# Patient Record
Sex: Male | Born: 1990 | Race: Black or African American | Hispanic: No | Marital: Single | Smoking: Never smoker
Health system: Southern US, Community
[De-identification: ages and names within clinical notes are randomized; demographics above are authoritative.]

---
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
## 2011-03-21 ENCOUNTER — Encounter (HOSPITAL_COMMUNITY): Payer: Self-pay | Admitting: Emergency Medicine

## 2011-03-21 ENCOUNTER — Emergency Department (HOSPITAL_COMMUNITY)
Admission: EM | Admit: 2011-03-21 | Discharge: 2011-03-21 | Disposition: A | Discharge status: Home / Self Care | Payer: Self-pay | Attending: Emergency Medicine | Admitting: Emergency Medicine

## 2011-03-21 DIAGNOSIS — R369 Urethral discharge, unspecified: Secondary | ICD-10-CM | POA: Insufficient documentation

## 2011-03-21 DIAGNOSIS — A64 Unspecified sexually transmitted disease: Secondary | ICD-10-CM | POA: Insufficient documentation

## 2011-03-21 DIAGNOSIS — N390 Urinary tract infection, site not specified: Secondary | ICD-10-CM | POA: Insufficient documentation

## 2011-03-21 LAB — URINALYSIS, ROUTINE W REFLEX MICROSCOPIC
Glucose, UA: NEGATIVE mg/dL
Hgb urine dipstick: NEGATIVE
Ketones, ur: >80 mg/dL — AB
Nitrite: NEGATIVE
Protein, ur: NEGATIVE mg/dL
Specific Gravity, Urine: 1.03 (ref 1.005–1.030)
Urobilinogen, UA: 1 mg/dL (ref 0.0–1.0)
pH: 5.5 (ref 5.0–8.0)

## 2011-03-21 LAB — URINE MICROSCOPIC-ADD ON

## 2011-03-21 MED ORDER — CEFTRIAXONE SODIUM 250 MG IJ SOLR
250.0000 mg | Freq: Once | INTRAMUSCULAR | Status: AC
  Administered 2011-03-21: 250 mg via INTRAMUSCULAR
  Filled 2011-03-21: qty 250

## 2011-03-21 MED ORDER — CIPROFLOXACIN HCL 500 MG PO TABS: 500.0000 mg | ORAL_TABLET | Freq: Two times a day (BID) | ORAL | Status: AC

## 2011-03-21 MED ORDER — ACETAMINOPHEN 325 MG PO TABS
650.0000 mg | ORAL_TABLET | Freq: Once | ORAL | Status: AC
  Administered 2011-03-21: 650 mg via ORAL
  Filled 2011-03-21: qty 2

## 2011-03-21 MED ORDER — METRONIDAZOLE 500 MG PO TABS: 500.0000 mg | ORAL_TABLET | Freq: Two times a day (BID) | ORAL | Status: AC

## 2011-03-21 MED ORDER — AZITHROMYCIN 250 MG PO TABS
1000.0000 mg | ORAL_TABLET | Freq: Once | ORAL | Status: AC
  Administered 2011-03-21: 1000 mg via ORAL
  Filled 2011-03-21: qty 4

## 2011-03-21 MED ORDER — LIDOCAINE HCL (PF) 1 % IJ SOLN
2.0000 mL | Freq: Once | INTRAMUSCULAR | Status: AC
  Administered 2011-03-21: 2 mL

## 2011-03-21 MED ORDER — LIDOCAINE HCL (PF) 1 % IJ SOLN
INTRAMUSCULAR | Status: AC
  Filled 2011-03-21: qty 5

## 2011-03-21 NOTE — ED Notes (Signed)
PT. REPORTS PENILE DISCHARGE WITH DYSURIA ONSET LAST WEEK , UNPROTECTED SEXUAL ENCOUNTER 2 WEEKS AGO.

## 2011-03-21 NOTE — ED Notes (Addendum)
C/o penile d/c & dysuria, (denies: nvd, fever or other sx), denies pain at this time. Onset of sx 2 weeks ago.

## 2011-03-21 NOTE — Discharge Instructions (Signed)
You were seen and evaluated today for your symptoms of painful urination and urinary discharge. At this time your providers are concerned that you may have a sexually transmitted disease. You were treated today in the emergency room for gonorrhea and chlamydia. You also given a prescription for medicines to take to treat a possible trichomonas infection. Your providers also gave you a prescription for antibiotics to take for the next 7 days for any other urinary infections. It is recommended that he followup with the [facility] health Department sexually transmitted disease clinic for HIV and syphilis testing. You should not have any unprotected sex until you have the results of your tests. You should also inform all partners of any positive results.   Urinary Tract Infection Infections of the urinary tract can start in several places. A bladder infection (cystitis), a kidney infection (pyelonephritis), and a prostate infection (prostatitis) are different types of urinary tract infections (UTIs). They usually get better if treated with medicines (antibiotics) that kill germs. Take all the medicine until it is gone. You or your child may feel better in a few days, but TAKE ALL MEDICINE or the infection may not respond and may become more difficult to treat. HOME CARE INSTRUCTIONS   Drink enough water and fluids to keep the urine clear or pale yellow. Cranberry juice is especially recommended, in addition to large amounts of water.   Avoid caffeine, tea, and carbonated beverages. They tend to irritate the bladder.   Alcohol may irritate the prostate.   Only take over-the-counter or prescription medicines for pain, discomfort, or fever as directed by your caregiver.  To prevent further infections:  Empty the bladder often. Avoid holding urine for long periods of time.   After a bowel movement, women should cleanse from front to back. Use each tissue only once.   Empty the bladder before and  after sexual intercourse.  FINDING OUT THE RESULTS OF YOUR TEST Not all test results are available during your visit. If your or your child's test results are not back during the visit, make an appointment with your caregiver to find out the results. Do not assume everything is normal if you have not heard from your caregiver or the medical facility. It is important for you to follow up on all test results. SEEK MEDICAL CARE IF:   There is back pain.   Your baby is older than 3 months with a rectal temperature of 100.5 F (38.1 C) or higher for more than 1 day.   Your or your child's problems (symptoms) are no better in 3 days. Return sooner if you or your child is getting worse.  SEEK IMMEDIATE MEDICAL CARE IF:   There is severe back pain or lower abdominal pain.   You or your child develops chills.   You have a fever.   Your baby is older than 3 months with a rectal temperature of 102 F (38.9 C) or higher.   Your baby is 66 months old or younger with a rectal temperature of 100.4 F (38 C) or higher.   There is nausea or vomiting.   There is continued burning or discomfort with urination.  MAKE SURE YOU:   Understand these instructions.   Will watch your condition.   Will get help right away if you are not doing well or get worse.  Document Released: 10/13/2004 Document Revised: 12/23/2010 Document Reviewed: 05/18/2006 [facility] Patient Information 2012 [place], [place].     Sexually Transmitted Disease Sexually transmitted disease (  STD) refers to any infection that is passed from person to person during sexual activity. This may happen by way of saliva, semen, blood, vaginal mucus, or urine. Common STDs include:  Gonorrhea.   Chlamydia.   Syphilis.   HIV/AIDS.   Genital herpes.   Hepatitis B and C.   Trichomonas.   Human papillomavirus (HPV).   Pubic lice.  CAUSES  An STD may be spread by bacteria, virus, or parasite. A person can get an STD  by:  Sexual intercourse with an infected person.   Sharing sex toys with an infected person.   Sharing needles with an infected person.   Having intimate contact with the genitals, mouth, or rectal areas of an infected person.  SYMPTOMS  Some people may not have any symptoms, but they can still pass the infection to others. Different STDs have different symptoms. Symptoms include:  Painful or bloody urination.   Pain in the pelvis, abdomen, vagina, anus, throat, or eyes.   Skin rash, itching, irritation, growths, or sores (lesions). These usually occur in the genital or anal area.   Abnormal vaginal discharge.   Penile discharge in men.   Soft, flesh-colored skin growths in the genital or anal area.   Fever.   Pain or bleeding during sexual intercourse.   Swollen glands in the groin area.   Yellow skin and eyes (jaundice). This is seen with hepatitis.  DIAGNOSIS  To make a diagnosis, your caregiver may:  Take a medical history.   Perform a physical exam.   Take a specimen (culture) to be examined.   Examine a sample of discharge under a microscope.   Perform blood tests.   Perform a Pap test, if this applies.   Perform a colposcopy.   Perform a laparoscopy.  TREATMENT   Chlamydia, gonorrhea, trichomonas, and syphilis can be cured with antibiotic medicine.   Genital herpes, hepatitis, and HIV can be treated, but not cured, with prescribed medicines. The medicines will lessen the symptoms.   Genital warts from HPV can be treated with medicine or by freezing, burning (electrocautery), or surgery. Warts may come back.   HPV is a virus and cannot be cured with medicine or surgery.However, abnormal areas may be followed very closely by your caregiver and may be removed from the cervix, vagina, or vulva through office procedures or surgery.  If your diagnosis is confirmed, your recent sexual partners need treatment. This is true even if they are symptom-free or  have a negative culture or evaluation. They should not have sex until their caregiver says it is okay. HOME CARE INSTRUCTIONS  All sexual partners should be informed, tested, and treated for all STDs.   Take your antibiotics as directed. Finish them even if you start to feel better.   Only take over-the-counter or prescription medicines for pain, discomfort, or fever as directed by your caregiver.   Rest.   Eat a balanced diet and drink enough fluids to keep your urine clear or pale yellow.   Do not have sex until treatment is completed and you have followed up with your caregiver. STDs should be checked after treatment.   Keep all follow-up appointments, Pap tests, and blood tests as directed by your caregiver.   Only use latex condoms and water-soluble lubricants during sexual activity. Do not use petroleum jelly or oils.   Avoid alcohol and illegal drugs.   Get vaccinated for HPV and hepatitis. If you have not received these vaccines in the past, talk to  your caregiver about whether one or both might be right for you.   Avoid risky sex practices that can break the skin.  The only way to avoid getting an STD is to avoid all sexual activity.Latex condoms and dental dams (for oral sex) will help lessen the risk of getting an STD, but will not completely eliminate the risk. SEEK MEDICAL CARE IF:   You have a fever.   You have any new or worsening symptoms.  Document Released: 03/26/2002 Document Revised: 12/23/2010 Document Reviewed: 04/02/2010 [facility] Patient Information 2012 [place], [place].    RESOURCE GUIDE  Dental Problems  Patients with Medicaid: [facility] [id number]                                           [phone] [address] Phone:  [phone]                                                  Phone:  [phone]  If unable to pay or uninsured, contact:  [facility] or [facility] to  become qualified for the adult dental clinic.  Chronic Pain Problems Contact [facility]  [phone] Patients need to be referred by their primary care doctor.  Insufficient Money for Medicine Contact [employer]:  call "211" or [facility] 641-764-5777.  No Primary Care Doctor Call [facility]  [phone] Other agencies that provide inexpensive medical care    [facility]  [phone]    [facility]  [phone]    [facility]  [phone]    [facility]  [phone]    [facility]  512-579-2073    [facility]  [phone]  Psychological Services [facility]  ([phone] [facility]  2676125715 [facility]   ([phone] (emergency services (910)245-7019)  Substance Abuse Resources [facility]  (515)740-3626 [facility] 682 174 7075 [facility] [place] ([phone] Floydene Flock 904-542-5901 Residential & Outpatient Substance Abuse Program  424-623-4076  Abuse/Neglect [facility] Child Abuse Hotline 515-298-4856 [facility] Child Abuse Hotline (754) 137-3195 (After Hours)  Emergency Shelter [facility] 901-543-6608  M[facility] 949-512-5079 [facility] 757-410-2222  MRSA Hotline #:   (507) 540-7571    [facility] Resources  [facility] [place] [facility]     [employer]                          [facility] Dept. [address] [place]                       [address]      [address]  [address]  Sela Hua Phone:  [phone]                                   Phone:  [phone]                 Phone:  [place] Phone:  [place] [phone] (After Hours)

## 2011-03-21 NOTE — ED Provider Notes (Signed)
History     CSN: [id number]  Arrival date & time 03/21/11  1638   First MD Initiated Contact with Patient 03/21/11 2115      Chief Complaint  Patient presents with  . Penile Discharge     HPI  History provided by the patient. Patient is a 21 year old male with no significant past medical history who presents with complaints of dysuria penile discharge for the past several days. Patient does admit to having unprotected sex 2 weeks ago prior to symptoms beginning. He denies having any testicle swelling or testicle pain. He denies any rash of the skin. He denies any fever, chills, sweats, nausea or vomiting. Symptoms came on gradually. There are no aggravating or alleviating factors. Patient has not tried anything for his symptoms. Patient is otherwise healthy.     History reviewed. No pertinent past medical history.  History reviewed. No pertinent past surgical history.  No family history on file.  History  Substance Use Topics  . Smoking status: Never Smoker   . Smokeless tobacco: Not on file  . Alcohol Use: No      Review of Systems  All other systems reviewed and are negative.    Allergies  Review of patient's allergies indicates no known allergies.  Home Medications  No current outpatient prescriptions on file.  There were no vitals taken for this visit.  Physical Exam  Nursing note and vitals reviewed. Constitutional: He is oriented to person, place, and time. He appears well-developed and well-nourished. No distress.  HENT:  Head: Normocephalic.  Cardiovascular: Normal rate and regular rhythm.   Pulmonary/Chest: Effort normal and breath sounds normal. No respiratory distress. He has no wheezes. He has no rales.  Abdominal: Soft. He exhibits no distension. There is no tenderness. There is no rebound and no CVA tenderness.  Genitourinary:       White penile discharge. Testicles normal without tenderness to palpation. No swelling. Skin normal no rashes. No  lymphadenopathy.  Neurological: He is alert and oriented to person, place, and time.  Skin: Skin is warm.  Psychiatric: He has a normal mood and affect. His behavior is normal.    ED Course  Procedures    Results for orders placed during the hospital encounter of 03/21/11  URINALYSIS, ROUTINE W REFLEX MICROSCOPIC      Component Value Range   Color, Urine YELLOW  YELLOW    APPearance HAZY (*) CLEAR    Specific Gravity, Urine 1.030  1.005 - 1.030    pH 5.5  5.0 - 8.0    Glucose, UA NEGATIVE  NEGATIVE (mg/dL)   Hgb urine dipstick NEGATIVE  NEGATIVE    Bilirubin Urine SMALL (*) NEGATIVE    Ketones, ur >80 (*) NEGATIVE (mg/dL)   Protein, ur NEGATIVE  NEGATIVE (mg/dL)   Urobilinogen, UA 1.0  0.0 - 1.0 (mg/dL)   Nitrite NEGATIVE  NEGATIVE    Leukocytes, UA MODERATE (*) NEGATIVE   URINE MICROSCOPIC-ADD ON      Component Value Range   Squamous Epithelial / LPF FEW (*) RARE    WBC, UA 21-50  <3 (WBC/hpf)   Bacteria, UA RARE  RARE    Urine-Other MUCOUS PRESENT    URINE CULTURE      Component Value Range   Specimen Description URINE, CLEAN CATCH     Special Requests ADDED ON [id number] @2200      Culture  Setup Time [id number]     Colony Count NO GROWTH     Culture NO GROWTH  Report Status 03/22/2011 FINAL    GC/CHLAMYDIA PROBE AMP, GENITAL      Component Value Range   GC Probe Amp, Genital POSITIVE (*) NEGATIVE    Chlamydia, DNA Probe NEGATIVE  NEGATIVE        1. Sexually transmitted disease   2. UTI (lower urinary tract infection)       MDM  9:20 PM patient seen and evaluated. Patient in no acute distress.        Angus Seller, [place] 03/23/11 [phone]

## 2011-03-22 LAB — URINE CULTURE
Colony Count: NO GROWTH
Culture  Setup Time: 201303042340
Culture: NO GROWTH

## 2011-03-22 LAB — GC/CHLAMYDIA PROBE AMP, GENITAL
Chlamydia, DNA Probe: NEGATIVE
GC Probe Amp, Genital: POSITIVE — AB

## 2011-03-23 NOTE — ED Notes (Signed)
+   gonorrhea Patient treated with Rocephin and zithromax-DHHS letter faxed 

## 2011-03-23 NOTE — ED Provider Notes (Signed)
Medical screening examination/treatment/procedure(s) were performed by non-physician practitioner and as supervising physician I was immediately available for consultation/collaboration.  Arnell Mausolf P Humberto Addo, MD 03/23/11 2319 

## 2011-03-25 NOTE — ED Notes (Signed)
Voicemail message left

## 2011-03-25 NOTE — ED Notes (Signed)
Voice mail message left for patient to return call. 

## 2011-03-26 NOTE — ED Notes (Signed)
Attempted to call patient. Left voicemail. Will send letter.

## 2013-03-08 ENCOUNTER — Emergency Department: Payer: Self-pay | Admitting: Emergency Medicine

## 2014-06-10 ENCOUNTER — Encounter (HOSPITAL_COMMUNITY): Payer: Self-pay | Admitting: Cardiology

## 2014-06-10 ENCOUNTER — Emergency Department (HOSPITAL_COMMUNITY)
Admission: EM | Admit: 2014-06-10 | Discharge: 2014-06-10 | Disposition: A | Discharge status: Home / Self Care | Payer: Self-pay | Attending: Emergency Medicine | Admitting: Emergency Medicine

## 2014-06-10 DIAGNOSIS — Y998 Other external cause status: Secondary | ICD-10-CM | POA: Insufficient documentation

## 2014-06-10 DIAGNOSIS — T148XXA Other injury of unspecified body region, initial encounter: Secondary | ICD-10-CM

## 2014-06-10 DIAGNOSIS — W208XXA Other cause of strike by thrown, projected or falling object, initial encounter: Secondary | ICD-10-CM | POA: Insufficient documentation

## 2014-06-10 DIAGNOSIS — S5012XA Contusion of left forearm, initial encounter: Secondary | ICD-10-CM | POA: Insufficient documentation

## 2014-06-10 DIAGNOSIS — Y9389 Activity, other specified: Secondary | ICD-10-CM | POA: Insufficient documentation

## 2014-06-10 DIAGNOSIS — Y9289 Other specified places as the place of occurrence of the external cause: Secondary | ICD-10-CM | POA: Insufficient documentation

## 2014-06-10 NOTE — ED Notes (Signed)
Pt got a tattoo on Sunday on the left forearm. States a branch hit him on the forearm on the same side and is now having pain. Tattoo is also peeling.

## 2014-06-10 NOTE — ED Provider Notes (Signed)
CSN: [id number]     Arrival date & time 06/10/14  1616 History  This chart was scribed for Arthor CaptainAbigail Gaynor Genco, working with Purvis SheffieldForrest Harrison, MD by Placido SouLogan Joldersma, ED Scribe. This patient was seen in room TR11C/TR11C and the patient's care was started at 4:47 PM.     Chief Complaint  Patient presents with  . Arm Pain    The history is provided by the patient. No language interpreter was used.    HPI Comments: Timothy Guzman is a 24 y.o. male who presents to the Emergency Department complaining of constant, mild, left lower arm pain after a branch fell and hit the area of complaint 1 day ago.  The pain is aggravated by certain motions and by clutching/lifting objects.  He also got a tattoo two days ago surrounding the area of complaint, however, he reports no abnormalities in the area until the incident with the branch.     History reviewed. No pertinent past medical history. History reviewed. No pertinent past surgical history. History reviewed. No pertinent family history. History  Substance Use Topics  . Smoking status: Never Smoker   . Smokeless tobacco: Not on file  . Alcohol Use: No    Review of Systems A complete 10 system review of systems was obtained and all systems are negative except as noted in the HPI and PMH.     Allergies  Review of patient's allergies indicates no known allergies.  Home Medications   Prior to Admission medications   Not on File   BP 121/68 mmHg  Pulse 69  Temp(Src) 98.7 F (37.1 C) (Oral)  Resp 16  SpO2 97% Physical Exam  Constitutional: He is oriented to person, place, and time. He appears well-developed and well-nourished. No distress.  HENT:  Head: Normocephalic and atraumatic.  Eyes: Conjunctivae and EOM are normal.  Neck: Neck supple.  Cardiovascular: Normal rate.   Pulmonary/Chest: Breath sounds normal. No respiratory distress.  Musculoskeletal:  New tattoo on the inside of the left forearm; apparent soft hematoma in the  medial forearm; no signs of infection; FROM and strength in the hand and wrist but pain with extension of the wrist; pulse and sensation intact.  Neurological: He is alert and oriented to person, place, and time.  Skin: Skin is warm.  Psychiatric: His behavior is normal.  Nursing note and vitals reviewed.   ED Course  Procedures  DIAGNOSTIC STUDIES: Oxygen Saturation is 97% on RA, normal by my interpretation.    COORDINATION OF CARE: 4:51 PM Discussed treatment plan with pt at bedside including Tylenol and application of ice to the affected area. Pt agreed to plan.  Labs Review Labs Reviewed - No data to display  Imaging Review No results found.   EKG Interpretation None      MDM   Final diagnoses:  Hematoma    Hematoma present. No signs of infection. Tattoo appears to be well-healing. He appears safe for discharge at this time  I personally performed the services described in this documentation, which was scribed in my presence. The recorded information has been reviewed and is accurate.      Arthor Captainbigail Dodi Leu, PA-C 06/17/14 2008  Purvis SheffieldForrest Harrison, MD 06/18/14 1539

## 2014-06-10 NOTE — Discharge Instructions (Signed)
Cryotherapy °Cryotherapy means treatment with cold. Ice or gel packs can be used to reduce both pain and swelling. Ice is the most helpful within the first 24 to 48 hours after an injury or flare-up from overusing a muscle or joint. Sprains, strains, spasms, burning pain, shooting pain, and aches can all be eased with ice. Ice can also be used when recovering from surgery. Ice is effective, has very few side effects, and is safe for most people to use. °PRECAUTIONS  °Ice is not a safe treatment option for people with: °· Raynaud phenomenon. This is a condition affecting small blood vessels in the extremities. Exposure to cold may cause your problems to return. °· Cold hypersensitivity. There are many forms of cold hypersensitivity, including: °· Cold urticaria. Red, itchy hives appear on the skin when the tissues begin to warm after being iced. °· Cold erythema. This is a red, itchy rash caused by exposure to cold. °· Cold hemoglobinuria. Red blood cells break down when the tissues begin to warm after being iced. The hemoglobin that carry oxygen are passed into the urine because they cannot combine with blood proteins fast enough. °· Numbness or altered sensitivity in the area being iced. °If you have any of the following conditions, do not use ice until you have discussed cryotherapy with your caregiver: °· Heart conditions, such as arrhythmia, angina, or chronic heart disease. °· High blood pressure. °· Healing wounds or open skin in the area being iced. °· Current infections. °· Rheumatoid arthritis. °· Poor circulation. °· Diabetes. °Ice slows the blood flow in the region it is applied. This is beneficial when trying to stop inflamed tissues from spreading irritating chemicals to surrounding tissues. However, if you expose your skin to cold temperatures for too long or without the proper protection, you can damage your skin or nerves. Watch for signs of skin damage due to cold. °HOME CARE INSTRUCTIONS °Follow  these tips to use ice and cold packs safely. °· Place a dry or damp towel between the ice and skin. A damp towel will cool the skin more quickly, so you may need to shorten the time that the ice is used. °· For a more rapid response, add gentle compression to the ice. °· Ice for no more than 10 to 20 minutes at a time. The bonier the area you are icing, the less time it will take to get the benefits of ice. °· Check your skin after 5 minutes to make sure there are no signs of a poor response to cold or skin damage. °· Rest 20 minutes or more between uses. °· Once your skin is numb, you can end your treatment. You can test numbness by very lightly touching your skin. The touch should be so light that you do not see the skin dimple from the pressure of your fingertip. When using ice, most people will feel these normal sensations in this order: cold, burning, aching, and numbness. °· Do not use ice on someone who cannot communicate their responses to pain, such as small children or people with dementia. °HOW TO MAKE AN ICE PACK °Ice packs are the most common way to use ice therapy. Other methods include ice massage, ice baths, and cryosprays. Muscle creams that cause a cold, tingly feeling do not offer the same benefits that ice offers and should not be used as a substitute unless recommended by your caregiver. °To make an ice pack, do one of the following: °· Place crushed ice or a   bag of frozen vegetables in a sealable plastic bag. Squeeze out the excess air. Place this bag inside another plastic bag. Slide the bag into a pillowcase or place a damp towel between your skin and the bag. °· Mix 3 parts water with 1 part rubbing alcohol. Freeze the mixture in a sealable plastic bag. When you remove the mixture from the freezer, it will be slushy. Squeeze out the excess air. Place this bag inside another plastic bag. Slide the bag into a pillowcase or place a damp towel between your skin and the bag. °SEEK MEDICAL CARE  IF: °· You develop white spots on your skin. This may give the skin a blotchy (mottled) appearance. °· Your skin turns blue or pale. °· Your skin becomes waxy or hard. °· Your swelling gets worse. °MAKE SURE YOU:  °· Understand these instructions. °· Will watch your condition. °· Will get help right away if you are not doing well or get worse. °Document Released: 08/30/2010 Document Revised: 05/20/2013 Document Reviewed: 08/30/2010 °ExitCare® Patient Information ©2015 [employer]. This information is not intended to replace advice given to you by your health care provider. Make sure you discuss any questions you have with your health care provider. ° °Hematoma °A hematoma is a collection of blood under the skin, in an organ, in a body space, in a joint space, or in other tissue. The blood can clot to form a lump that you can see and feel. The lump is often firm and may sometimes become sore and tender. Most hematomas get better in a few days to weeks. However, some hematomas may be serious and require medical care. Hematomas can range in size from very small to very large. °CAUSES  °A hematoma can be caused by a blunt or penetrating injury. It can also be caused by spontaneous leakage from a blood vessel under the skin. Spontaneous leakage from a blood vessel is more likely to occur in older people, especially those taking blood thinners. Sometimes, a hematoma can develop after certain medical procedures. °SIGNS AND SYMPTOMS  °· A firm lump on the body. °· Possible pain and tenderness in the area. °· Bruising. Blue, dark blue, purple-red, or yellowish skin may appear at the site of the hematoma if the hematoma is close to the surface of the skin. °For hematomas in deeper tissues or body spaces, the signs and symptoms may be subtle. For example, an intra-abdominal hematoma may cause abdominal pain, weakness, fainting, and shortness of breath. An intracranial hematoma may cause a headache or symptoms such as  weakness, trouble speaking, or a change in consciousness. °DIAGNOSIS  °A hematoma can usually be diagnosed based on your medical history and a physical exam. Imaging tests may be needed if your health care provider suspects a hematoma in deeper tissues or body spaces, such as the abdomen, head, or chest. These tests may include ultrasonography or a CT scan.  °TREATMENT  °Hematomas usually go away on their own over time. Rarely does the blood need to be drained out of the body. Large hematomas or those that may affect vital organs will sometimes need surgical drainage or monitoring. °HOME CARE INSTRUCTIONS  °· Apply ice to the injured area:   °¨ Put ice in a plastic bag.   °¨ Place a towel between your skin and the bag.   °¨ Leave the ice on for 20 minutes, 2-3 times a day for the first 1 to 2 days.   °· After the first 2 days, switch to using warm compresses   on the hematoma.   °· Elevate the injured area to help decrease pain and swelling. Wrapping the area with an elastic bandage may also be helpful. Compression helps to reduce swelling and promotes shrinking of the hematoma. Make sure the bandage is not wrapped too tight.   °· If your hematoma is on a lower extremity and is painful, crutches may be helpful for a couple days.   °· Only take over-the-counter or prescription medicines as directed by your health care provider. °SEEK IMMEDIATE MEDICAL CARE IF:  °· You have increasing pain, or your pain is not controlled with medicine.   °· You have a fever.   °· You have worsening swelling or discoloration.   °· Your skin over the hematoma breaks or starts bleeding.   °· Your hematoma is in your chest or abdomen and you have weakness, shortness of breath, or a change in consciousness. °· Your hematoma is on your scalp (caused by a fall or injury) and you have a worsening headache or a change in alertness or consciousness. °MAKE SURE YOU:  °· Understand these instructions. °· Will watch your condition. °· Will get help  right away if you are not doing well or get worse. °Document Released: 08/18/2003 Document Revised: 09/05/2012 Document Reviewed: 06/13/2012 °ExitCare® Patient Information ©2015 [employer]. This information is not intended to replace advice given to you by your health care provider. Make sure you discuss any questions you have with your health care provider. ° °
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
## 2018-09-10 ENCOUNTER — Emergency Department (HOSPITAL_BASED_OUTPATIENT_CLINIC_OR_DEPARTMENT_OTHER)
Admission: EM | Admit: 2018-09-10 | Discharge: 2018-09-10 | Disposition: A | Discharge status: Home / Self Care | Payer: 59 | Attending: Emergency Medicine | Admitting: Emergency Medicine

## 2018-09-10 ENCOUNTER — Encounter (HOSPITAL_BASED_OUTPATIENT_CLINIC_OR_DEPARTMENT_OTHER): Payer: Self-pay | Admitting: Emergency Medicine

## 2018-09-10 ENCOUNTER — Other Ambulatory Visit: Payer: Self-pay

## 2018-09-10 DIAGNOSIS — Z20828 Contact with and (suspected) exposure to other viral communicable diseases: Secondary | ICD-10-CM | POA: Diagnosis not present

## 2018-09-10 DIAGNOSIS — J069 Acute upper respiratory infection, unspecified: Secondary | ICD-10-CM | POA: Diagnosis not present

## 2018-09-10 DIAGNOSIS — Z20822 Contact with and (suspected) exposure to covid-19: Secondary | ICD-10-CM

## 2018-09-10 DIAGNOSIS — R509 Fever, unspecified: Secondary | ICD-10-CM | POA: Diagnosis present

## 2018-09-10 NOTE — ED Triage Notes (Signed)
Headache, cough, sore throat, fever for 3 days.  Unknown fever.

## 2018-09-10 NOTE — Discharge Instructions (Addendum)
You were tested for the novel coronavirus today.  You need to home isolate yourself.  If you develop worsening symptoms such as shortness of breath, uncontrolled vomiting, or any other new/concerning symptoms then return to the ER for evaluation. °

## 2018-09-10 NOTE — ED Provider Notes (Signed)
[facility] EMERGENCY DEPARTMENT Provider Note   CSN: [id number] Arrival date & time: 09/10/18  [id number]     History   Chief Complaint Chief Complaint  Patient presents with  . URI    HPI Rivka SaferDominique L Viar is a 28 y.o. male.     HPI  28 year old male presents with low-grade fever, trace cough, rhinorrhea and sore throat.  Patient is here with multiple family members with similar.  They are concerned about infection with the novel coronavirus.  No shortness of breath or vomiting.  No past medical history on file.  There are no active problems to display for this patient.   No past surgical history on file.      Home Medications    Prior to Admission medications   Medication Sig Start Date End Date Taking? Authorizing Provider  nabumetone (RELAFEN) 750 MG tablet TK 1 T PO Q 12 HOURS 08/06/18   [provider]  SILDENAFIL CITRATE PO Take 30 mg by mouth as needed. 08/16/18   [provider]  tiZANidine (ZANAFLEX) 4 MG tablet TK 1 T PO TID 08/06/18   [provider]  traMADol (ULTRAM) 50 MG tablet TK 1 T PO QID PRF PAIN 08/09/18   [provider]    Family History No family history on file.  Social History Social History   Tobacco Use  . Smoking status: Never Smoker  . Smokeless tobacco: Never Used  Substance Use Topics  . Alcohol use: No  . Drug use: No     Allergies   Patient has no known allergies.   Review of Systems Review of Systems  Constitutional: Positive for fever.  HENT: Positive for sore throat.   Respiratory: Positive for cough. Negative for shortness of breath.      Physical Exam Updated Vital Signs BP 133/78 (BP Location: Right Arm)   Pulse (!) 58   Temp 99.6 F (37.6 C) (Oral)   Resp 18   Ht 6' (1.829 m)   Wt 82.6 kg   SpO2 98%   BMI 24.68 kg/m   Physical Exam Vitals signs and nursing note reviewed.  Constitutional:      General: He is not in acute distress.    Appearance: He is  well-developed. He is not ill-appearing or diaphoretic.  HENT:     Head: Normocephalic and atraumatic.     Right Ear: External ear normal.     Left Ear: External ear normal.     Nose: Nose normal.     Mouth/Throat:     Pharynx: Posterior oropharyngeal erythema (mild) present.  Eyes:     General:        Right eye: No discharge.        Left eye: No discharge.  Neck:     Musculoskeletal: Normal range of motion and neck supple. No neck rigidity.  Cardiovascular:     Rate and Rhythm: Normal rate and regular rhythm.     Heart sounds: Normal heart sounds.  Pulmonary:     Effort: Pulmonary effort is normal.     Breath sounds: Normal breath sounds.  Abdominal:     Palpations: Abdomen is soft.     Tenderness: There is no abdominal tenderness.  Skin:    General: Skin is warm and dry.  Neurological:     Mental Status: He is alert.  Psychiatric:        Mood and Affect: Mood is not anxious.      ED Treatments / Results  Labs (all labs ordered are listed, but only abnormal results are displayed) Labs Reviewed  NOVEL CORONAVIRUS, NAA (HOSPITAL ORDER, SEND-OUT TO REF LAB)    EKG None  Radiology No results found.  Procedures Procedures (including critical care time)  Medications Ordered in ED Medications - No data to display   Initial Impression / Assessment and Plan / ED Course  I have reviewed the triage vital signs and the nursing notes.  Pertinent labs & imaging results that were available during my care of the patient were reviewed by me and considered in my medical decision making (see chart for details).        Patient appears quite well.  He does have some mild erythema in his throat but given he is here with 3 other family members with similar, my suspicion is pretty high for a viral illness.  Could be the novel coronavirus of testing will be performed as an outpatient.  He has trace cough but clear lungs and no increased work of breathing or hypoxia.  Doubt  pneumonia or other bacterial illness.  MADHAV MOHON was evaluated in Emergency Department on 09/10/2018 for the symptoms described in the history of present illness. He was evaluated in the context of the global COVID-19 pandemic, which necessitated consideration that the patient might be at risk for infection with the SARS-CoV-2 virus that causes COVID-19. Institutional protocols and algorithms that pertain to the evaluation of patients at risk for COVID-19 are in a state of rapid change based on information released by regulatory bodies including the CDC and federal and state organizations. These policies and algorithms were followed during the patient's care in the ED.   Final Clinical Impressions(s) / ED Diagnoses   Final diagnoses:  Viral URI with cough  Suspected Covid-19 Virus Infection    ED Discharge Orders    None       Sherwood Gambler, MD 09/10/18 [phone]

## 2018-09-11 LAB — NOVEL CORONAVIRUS, NAA (HOSP ORDER, SEND-OUT TO REF LAB; TAT 18-24 HRS): SARS-CoV-2, NAA: NOT DETECTED

## 2019-08-10 ENCOUNTER — Other Ambulatory Visit: Payer: Self-pay

## 2019-08-10 ENCOUNTER — Encounter (HOSPITAL_BASED_OUTPATIENT_CLINIC_OR_DEPARTMENT_OTHER): Payer: Self-pay | Admitting: Emergency Medicine

## 2019-08-10 ENCOUNTER — Emergency Department (HOSPITAL_BASED_OUTPATIENT_CLINIC_OR_DEPARTMENT_OTHER)
Admission: EM | Admit: 2019-08-10 | Discharge: 2019-08-10 | Disposition: A | Discharge status: Home / Self Care | Payer: Self-pay | Attending: Emergency Medicine | Admitting: Emergency Medicine

## 2019-08-10 DIAGNOSIS — Y999 Unspecified external cause status: Secondary | ICD-10-CM | POA: Insufficient documentation

## 2019-08-10 DIAGNOSIS — S62339A Displaced fracture of neck of unspecified metacarpal bone, initial encounter for closed fracture: Secondary | ICD-10-CM

## 2019-08-10 DIAGNOSIS — Y939 Activity, unspecified: Secondary | ICD-10-CM | POA: Insufficient documentation

## 2019-08-10 DIAGNOSIS — R200 Anesthesia of skin: Secondary | ICD-10-CM | POA: Insufficient documentation

## 2019-08-10 DIAGNOSIS — Y929 Unspecified place or not applicable: Secondary | ICD-10-CM | POA: Insufficient documentation

## 2019-08-10 DIAGNOSIS — W228XXA Striking against or struck by other objects, initial encounter: Secondary | ICD-10-CM | POA: Insufficient documentation

## 2019-08-10 DIAGNOSIS — S62316A Displaced fracture of base of fifth metacarpal bone, right hand, initial encounter for closed fracture: Secondary | ICD-10-CM | POA: Insufficient documentation

## 2019-08-10 MED ORDER — HYDROCODONE-ACETAMINOPHEN 5-325 MG PO TABS: 1.0000 | ORAL_TABLET | Freq: Four times a day (QID) | ORAL | 0 refills | Status: AC | PRN

## 2019-08-10 MED ORDER — HYDROCODONE-ACETAMINOPHEN 5-325 MG PO TABS
1.0000 | ORAL_TABLET | Freq: Once | ORAL | Status: AC
  Administered 2019-08-10: 1 via ORAL
  Filled 2019-08-10: qty 1

## 2019-08-10 NOTE — ED Triage Notes (Signed)
Pt dx with broken hand yesterday at [facility]. He states he took the splint off due to increased pain and swelling and is here today to be rechecked.

## 2019-08-10 NOTE — ED Notes (Signed)
ED Provider at bedside. 

## 2019-08-10 NOTE — ED Notes (Signed)
Pt discharged to home. Discharge instructions have been discussed with patient and/or family members. Pt verbally acknowledges understanding d/c instructions, and endorses comprehension to checkout at registration before leaving.  °

## 2019-08-10 NOTE — Discharge Instructions (Signed)
Continue to elevate your hand as much as possible. You may loosen the splint if it feels too tight. The medication prescribed can caused drowsiness, addiction and overdose. Please only take for severe pain. Thank you for allowing me to care for you today. Please return to the emergency department if you have new or worsening symptoms. Take your medications as instructed.

## 2019-08-10 NOTE — ED Provider Notes (Signed)
[facility] EMERGENCY DEPARTMENT Provider Note   CSN: [id number] Arrival date & time: 08/10/19  1205     History Chief Complaint  Patient presents with  . Hand Injury    Timothy Guzman is a 29 y.o. male.  Patient is a 29 y/o M with no PMH presenting to the ER for second opinion of boxers fracture sustained yesterday when he punched a door. Seen at [place], had xrays and ortho consult and was placed in splint and advised f/u with ortho Monday. Patient reports this morning his hand was more swollen and he felt numb so he removed the splint. He is taking only motrin for pain.         History reviewed. No pertinent past medical history.  There are no problems to display for this patient.   History reviewed. No pertinent surgical history.     No family history on file.  Social History   Tobacco Use  . Smoking status: Never Smoker  . Smokeless tobacco: Never Used  Substance Use Topics  . Alcohol use: No  . Drug use: No    Home Medications Prior to Admission medications   Medication Sig Start Date End Date Taking? Authorizing Provider  HYDROcodone-acetaminophen (NORCO/VICODIN) 5-325 MG tablet Take 1 tablet by mouth every 6 (six) hours as needed for up to 2 days for severe pain. 08/10/19 08/12/19  Ronnie Doss A, PA-C  nabumetone (RELAFEN) 750 MG tablet TK 1 T PO Q 12 HOURS 08/06/18   [provider]  SILDENAFIL CITRATE PO Take 30 mg by mouth as needed. 08/16/18   [provider]  tiZANidine (ZANAFLEX) 4 MG tablet TK 1 T PO TID 08/06/18   [provider]  traMADol (ULTRAM) 50 MG tablet TK 1 T PO QID PRF PAIN 08/09/18   [provider]    Allergies    Amoxicillin  Review of Systems   Review of Systems  Constitutional: Negative for chills and fever.  Musculoskeletal: Positive for arthralgias and joint swelling.  Skin: Negative for rash and wound.  Neurological: Positive for numbness.    Physical Exam Updated  Vital Signs BP 126/68 (BP Location: Right Arm)   Pulse 67   Temp 98.8 F (37.1 C) (Oral)   Resp 19   Ht 6\' 2"  (1.88 m)   Wt 81.6 kg   SpO2 100%   BMI 23.11 kg/m   Physical Exam Vitals reviewed.  Constitutional:      General: He is not in acute distress.    Appearance: Normal appearance. He is not ill-appearing, toxic-appearing or diaphoretic.  Cardiovascular:     Rate and Rhythm: Normal rate and regular rhythm.  Skin:    General: Skin is warm.  Neurological:     Mental Status: He is alert.     ED Results / Procedures / Treatments   Labs (all labs ordered are listed, but only abnormal results are displayed) Labs Reviewed - No data to display  EKG None  Radiology No results found.  Procedures Procedures (including critical care time)  Medications Ordered in ED Medications  HYDROcodone-acetaminophen (NORCO/VICODIN) 5-325 MG per tablet 1 tablet (1 tablet Oral Given 08/10/19 1307)    ED Course  I have reviewed the triage vital signs and the nursing notes.  Pertinent labs & imaging results that were available during my care of the patient were reviewed by me and considered in my medical decision making (see chart for details).  Clinical Course as of Aug 10 1310  Sat Aug 10, 2019  1301 Reviewed xray from yesterday. Patietn has a Comminuted fracture of the distal right fifth metacarpal noted.  Angulation deformity noted. No evidence of dislocation. Noradiopaque foreign body.  Patient splint has been off for only a few hours. He is fully neurovascularly in tact at this time. Will advised patient to continue RICE. Will apply a new more comfortable splint and refer back to ortho. Ortho was consulted yesterday during ED visit and advised outpatient f/u. Discussed with Dr. Criss Alvine and plan agreed upon   [KM]    Clinical Course User Index [KM] Jeral Pinch   MDM Rules/Calculators/A&P                          Based on review of vitals, medical screening  exam, lab work and/or imaging, there does not appear to be an acute, emergent etiology for the patient's symptoms. Counseled pt on good return precautions and encouraged both PCP and ED follow-up as needed.  Prior to discharge, I also discussed incidental imaging findings with patient in detail and advised appropriate, recommended follow-up in detail.  Clinical Impression: 1. Closed boxer's fracture, initial encounter     Disposition: Discharge  Prior to providing a prescription for a controlled substance, I independently reviewed the patient's recent prescription history on the [place] Controlled Substance Reporting System. The patient had no recent or regular prescriptions and was deemed appropriate for a brief, less than 3 day prescription of narcotic for acute analgesia.  This note was prepared with assistance of Conservation officer, historic buildings. Occasional wrong-word or sound-a-like substitutions may have occurred due to the inherent limitations of voice recognition software.  Final Clinical Impression(s) / ED Diagnoses Final diagnoses:  Closed boxer's fracture, initial encounter    Rx / DC Orders ED Discharge Orders         Ordered    HYDROcodone-acetaminophen (NORCO/VICODIN) 5-325 MG tablet  Every 6 hours PRN     Discontinue  Reprint     08/10/19 1312           Jeral Pinch 08/10/19 1749    Pricilla Loveless, MD 08/14/19 [phone]
